# Patient Record
Sex: Male | Born: 2005 | Race: White | Hispanic: No | Marital: Single | Smoking: Never smoker
Health system: Southern US, Community
[De-identification: ages and names within clinical notes are randomized; demographics above are authoritative.]

## PROBLEM LIST (undated history)

## (undated) HISTORY — PX: NO PAST SURGERIES: SHX2092

---
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
## 2024-04-23 DIAGNOSIS — J342 Deviated nasal septum: Secondary | ICD-10-CM | POA: Diagnosis not present

## 2024-04-23 DIAGNOSIS — M950 Acquired deformity of nose: Secondary | ICD-10-CM | POA: Diagnosis not present

## 2024-06-12 ENCOUNTER — Other Ambulatory Visit: Payer: Self-pay | Admitting: Otolaryngology

## 2024-07-19 ENCOUNTER — Ambulatory Visit: Admit: 2024-07-19 | Admitting: Otolaryngology
